# Patient Record
Sex: Male | Born: 1985 | Race: Black or African American | Hispanic: No | Marital: Single | Smoking: Former smoker
Health system: Southern US, Community
[De-identification: ages and names within clinical notes are randomized; demographics above are authoritative.]

## PROBLEM LIST (undated history)

## (undated) DIAGNOSIS — F1910 Other psychoactive substance abuse, uncomplicated: Secondary | ICD-10-CM

---

## 2019-12-23 ENCOUNTER — Other Ambulatory Visit: Payer: Self-pay

## 2019-12-23 ENCOUNTER — Emergency Department (HOSPITAL_COMMUNITY)
Admission: EM | Admit: 2019-12-23 | Discharge: 2019-12-23 | Disposition: A | Discharge status: Home / Self Care | Payer: Self-pay | Attending: Emergency Medicine | Admitting: Emergency Medicine

## 2019-12-23 ENCOUNTER — Telehealth (HOSPITAL_COMMUNITY): Payer: Self-pay | Admitting: Emergency Medicine

## 2019-12-23 ENCOUNTER — Encounter (HOSPITAL_COMMUNITY): Payer: Self-pay | Admitting: Obstetrics and Gynecology

## 2019-12-23 ENCOUNTER — Emergency Department (HOSPITAL_COMMUNITY): Payer: Self-pay

## 2019-12-23 DIAGNOSIS — Z87891 Personal history of nicotine dependence: Secondary | ICD-10-CM | POA: Insufficient documentation

## 2019-12-23 DIAGNOSIS — R1032 Left lower quadrant pain: Secondary | ICD-10-CM | POA: Insufficient documentation

## 2019-12-23 DIAGNOSIS — R109 Unspecified abdominal pain: Secondary | ICD-10-CM

## 2019-12-23 DIAGNOSIS — M545 Low back pain, unspecified: Secondary | ICD-10-CM

## 2019-12-23 HISTORY — DX: Other psychoactive substance abuse, uncomplicated: F19.10

## 2019-12-23 LAB — COMPREHENSIVE METABOLIC PANEL
ALT: 45 U/L — ABNORMAL HIGH (ref 0–44)
AST: 28 U/L (ref 15–41)
Albumin: 4 g/dL (ref 3.5–5.0)
Alkaline Phosphatase: 66 U/L (ref 38–126)
Anion gap: 8 (ref 5–15)
BUN: 17 mg/dL (ref 6–20)
CO2: 25 mmol/L (ref 22–32)
Calcium: 8.9 mg/dL (ref 8.9–10.3)
Chloride: 106 mmol/L (ref 98–111)
Creatinine, Ser: 1.18 mg/dL (ref 0.61–1.24)
GFR calc Af Amer: >60 mL/min (ref >60)
GFR calc non Af Amer: >60 mL/min (ref >60)
Glucose, Bld: 94 mg/dL (ref 70–99)
Potassium: 3.9 mmol/L (ref 3.5–5.1)
Sodium: 139 mmol/L (ref 135–145)
Total Bilirubin: 0.8 mg/dL (ref 0.3–1.2)
Total Protein: 7.2 g/dL (ref 6.5–8.1)

## 2019-12-23 LAB — CBC
HCT: 46.2 % (ref 39.0–52.0)
Hemoglobin: 15.1 g/dL (ref 13.0–17.0)
MCH: 31 pg (ref 26.0–34.0)
MCHC: 32.7 g/dL (ref 30.0–36.0)
MCV: 94.9 fL (ref 80.0–100.0)
Platelets: 224 10*3/uL (ref 150–400)
RBC: 4.87 MIL/uL (ref 4.22–5.81)
RDW: 12 % (ref 11.5–15.5)
WBC: 5 10*3/uL (ref 4.0–10.5)
nRBC: 0 % (ref 0.0–0.2)

## 2019-12-23 LAB — URINALYSIS, ROUTINE W REFLEX MICROSCOPIC
Bilirubin Urine: NEGATIVE
Glucose, UA: NEGATIVE mg/dL
Hgb urine dipstick: NEGATIVE
Ketones, ur: NEGATIVE mg/dL
Leukocytes,Ua: NEGATIVE
Nitrite: NEGATIVE
Protein, ur: NEGATIVE mg/dL
Specific Gravity, Urine: 1.026 (ref 1.005–1.030)
pH: 5 (ref 5.0–8.0)

## 2019-12-23 LAB — LIPASE, BLOOD: Lipase: 33 U/L (ref 11–51)

## 2019-12-23 MED ORDER — HYDROCODONE-HOMATROPINE 5-1.5 MG/5ML PO SYRP: 5.0000 mL | ORAL_SOLUTION | Freq: Four times a day (QID) | ORAL | 0 refills | Status: AC | PRN

## 2019-12-23 MED ORDER — KETOROLAC TROMETHAMINE 30 MG/ML IJ SOLN
30.0000 mg | Freq: Once | INTRAMUSCULAR | Status: AC
  Administered 2019-12-23: 30 mg via INTRAVENOUS
  Filled 2019-12-23: qty 1

## 2019-12-23 MED ORDER — ACETAMINOPHEN 325 MG PO TABS
650.0000 mg | ORAL_TABLET | Freq: Once | ORAL | Status: AC
  Administered 2019-12-23: 650 mg via ORAL
  Filled 2019-12-23: qty 2

## 2019-12-23 MED ORDER — CYCLOBENZAPRINE HCL 10 MG PO TABS: 10.0000 mg | ORAL_TABLET | Freq: Two times a day (BID) | ORAL | 0 refills | Status: AC | PRN

## 2019-12-23 MED ORDER — SODIUM CHLORIDE 0.9% FLUSH: 3.0000 mL | Freq: Once | INTRAVENOUS | Status: DC

## 2019-12-23 NOTE — Telephone Encounter (Signed)
9:25 AM Received call from [facility] reporting that the patient's prescription from overnight they cannot fill due to not having the medication in stock.  They requested switching to Hydromet.  Medication prescription will be changed and this was sent.

## 2019-12-23 NOTE — Discharge Instructions (Signed)
Your work-up today was overall reassuring and we did not find any evidence of kidney stone or infection causing her symptoms.  I suspect a musculoskeletal type pain given the small muscle spasms we palpated.  Please use the muscle relaxant to help with your symptoms and you may use over-the-counter medication such as Motrin and Tylenol.  Please rest and stay hydrated.  If any symptoms change or worsen, please return to the nearest emergency department.

## 2019-12-23 NOTE — ED Triage Notes (Signed)
Pt reports left sided abdominal pain. Patient is pointing to rib cage and describing a stabbing pain.  Patient denies N/V but reports incidence of diarrhea. Patient reports pain after eating.

## 2019-12-23 NOTE — ED Provider Notes (Signed)
[facility] DEPT Provider Note   CSN: [id number] Arrival date & time: 12/23/19  1950     History Chief Complaint  Patient presents with  . Abdominal Pain    Brian Hensley is a 33 y.o. male.  The history is provided by the patient and medical records. No language interpreter was used.  Flank Pain This is a new problem. The current episode started more than 2 days ago. The problem occurs constantly. The problem has not changed since onset.Associated symptoms include abdominal pain. Pertinent negatives include no chest pain, no headaches and no shortness of breath. Nothing aggravates the symptoms. Nothing relieves the symptoms. He has tried nothing for the symptoms. The treatment provided no relief.       Past Medical History:  Diagnosis Date  . Drug abuse ([place])     There are no problems to display for this patient.   History reviewed. No pertinent surgical history.     History reviewed. No pertinent family history.  Social History   Tobacco Use  . Smoking status: Former Smoker  Substance Use Topics  . Alcohol use: Not Currently    Comment: Pt is in [employer]  . Drug use: Not Currently    Home Medications Prior to Admission medications   Not on File    Allergies    Patient has no allergy information on record.  Review of Systems   Review of Systems  Constitutional: Negative for chills, fatigue and fever.  HENT: Negative for congestion and rhinorrhea.   Respiratory: Negative for cough, chest tightness, shortness of breath and wheezing.   Cardiovascular: Negative for chest pain, palpitations and leg swelling.  Gastrointestinal: Positive for abdominal pain. Negative for constipation, diarrhea, nausea and vomiting.  Genitourinary: Positive for flank pain. Negative for decreased urine volume, dysuria, frequency, genital sores, scrotal swelling, testicular pain and urgency.       Urine darker  Musculoskeletal: Positive  for back pain. Negative for neck pain and neck stiffness.  Skin: Negative for rash and wound.  Neurological: Negative for light-headedness and headaches.  Psychiatric/Behavioral: Negative for agitation.  All other systems reviewed and are negative.   Physical Exam Updated Vital Signs BP 134/78 (BP Location: Left Arm)   Pulse 86   Temp 98.6 F (37 C) (Oral)   Resp 18   Ht 5\' 11"  (1.803 m)   Wt 102.1 kg   SpO2 99%   BMI 31.38 kg/m   Physical Exam Vitals and nursing note reviewed.  Constitutional:      General: He is not in acute distress.    Appearance: He is well-developed. He is not ill-appearing, toxic-appearing or diaphoretic.  HENT:     Head: Normocephalic and atraumatic.  Eyes:     Conjunctiva/sclera: Conjunctivae normal.  Cardiovascular:     Rate and Rhythm: Normal rate and regular rhythm.     Heart sounds: No murmur.  Pulmonary:     Effort: Pulmonary effort is normal. No respiratory distress.     Breath sounds: Normal breath sounds.  Abdominal:     General: Abdomen is flat. Bowel sounds are normal.     Palpations: Abdomen is soft.     Tenderness: There is no abdominal tenderness. There is no right CVA tenderness or left CVA tenderness.    Musculoskeletal:     Cervical back: Neck supple.       Back:  Skin:    General: Skin is warm and dry.  Neurological:     General:  No focal deficit present.     Mental Status: He is alert.     ED Results / Procedures / Treatments   Labs (all labs ordered are listed, but only abnormal results are displayed) Labs Reviewed  COMPREHENSIVE METABOLIC PANEL - Abnormal; Notable for the following components:      Result Value   ALT 45 (*)    All other components within normal limits  URINE CULTURE  LIPASE, BLOOD  CBC  URINALYSIS, ROUTINE W REFLEX MICROSCOPIC    EKG None  Radiology CT Renal Stone Study  Result Date: 12/23/2019 CLINICAL DATA:  Left-sided abdominal/flank pain EXAM: CT ABDOMEN AND PELVIS WITHOUT  CONTRAST TECHNIQUE: Multidetector CT imaging of the abdomen and pelvis was performed following the standard protocol without oral or IV contrast. COMPARISON:  None. FINDINGS: Lower chest: There is mild atelectasis in the lung bases. There is no lung base edema or consolidation. Hepatobiliary: No focal liver lesions are appreciable on this noncontrast enhanced study. Gallbladder wall does not appear thickened. There is no biliary duct dilatation. Pancreas: There is no pancreatic mass or inflammatory focus. Spleen: No splenic lesions are evident. Adrenals/Urinary Tract: Adrenals bilaterally appear normal. Kidneys bilaterally show no evident mass or hydronephrosis on either side. There is no appreciable renal or ureteral calculus on either side. There are phleboliths on the left, apparently inferior to the distal left ureter. Urinary bladder is midline with wall thickness within normal limits. Stomach/Bowel: There is no appreciable bowel wall or mesenteric thickening. Moderate stool noted in colon. Terminal ileum appears normal. There is no evident bowel obstruction. There is no free air or portal venous air. Vascular/Lymphatic: No abdominal aortic aneurysm. No vascular lesions are appreciable this noncontrast enhanced study. There is no evident adenopathy in the abdomen or pelvis. Reproductive: There are occasional tiny prostatic calculi. Prostate and seminal vesicles are normal in size and configuration. No evident pelvic mass. Other: The appendix appears normal. There is no evident abscess or ascites in the abdomen or pelvis. Musculoskeletal: There are no blastic or lytic bone lesions. There is no intramuscular or abdominal wall lesion. IMPRESSION: 1. A cause for patient's symptoms has not been established with this study. 2. No renal or ureteral calculi. No hydronephrosis. Urinary bladder wall thickness normal. There are occasional tiny calcifications in the prostate. 3. No bowel obstruction. No abscess in the  abdomen or pelvis. Appendix appears normal. Electronically Signed   By: Bretta Bang III M.D.   On: 12/23/2019 09:55    Procedures Procedures (including critical care time)  Medications Ordered in ED Medications  ketorolac (TORADOL) 30 MG/ML injection 30 mg (30 mg Intravenous Given 12/23/19 0936)  acetaminophen (TYLENOL) tablet 650 mg (650 mg Oral Given 12/23/19 6256)    ED Course  I have reviewed the triage vital signs and the nursing notes.  Pertinent labs & imaging results that were available during my care of the patient were reviewed by me and considered in my medical decision making (see chart for details).    MDM Rules/Calculators/A&P                      Brian Hensley is a 33 y.o. male with a past medical history significant for narcotic abuse who presents with left flank pain for the last 4 days.  Patient reports that he has never had this pain before and for the last 4 days he has had severe pain in his left flank and left back rating around towards his left  abdomen.  He has no history of kidney stones but does report his urine is darker.  He denies dysuria.  He denies fevers, chills, nausea, vomiting, constipation, or trauma.  He does report some mild diarrhea.  No recent Covid contacts.  Patient describes his pain is a 9-10 and stabbing.  He cannot get comfortable.  He denies any history of diverticulitis or other abdominal problems.  No history of pancreatitis.  He does report that his pain is worsened after he eats or drinks.  He denies any respiratory symptoms.  On exam, patient does have tenderness in his left CVA and left flank area.  No rash to suggest shingles.  No other abdominal tenderness.  Lungs clear and chest is nontender.  Midline is nontender in the back.  No leg symptoms.  He denied current scrotal pain or swelling.  Clinically most concerned about kidney stone versus musculoskeletal versus a pancreatic issue.  Will get labs and CT stone study.  Will  get urinalysis with culture.  I spoke with the patient's narcotic recovery advocate and we all 3 agreed to hold on narcotic ministration until [place] necessary.  We agreed to get the labs to see if he is a candidate for nonnarcotic treatment such as Tylenol or Toradol when appropriate.  We will give this medication when his labs return if it is okay.  Anticipate reassessment after work-up.  12:07 PM Patient's work-up is returned overall reassuring.  The CT scan does not show any evidence of kidney stone or other abnormality.  Unclear etiology at this time given reassuring labs and urinalysis.  Suspect musculoskeletal pain and muscle spasm.  We will continue the nonnarcotic plan and give the patient a prescription for muscle relaxant and have him use over-the-counter medications.  He will follow-up with a PCP and understood return precautions.  He had otherwise no concerns and was discharged in good condition.   Final Clinical Impression(s) / ED Diagnoses Final diagnoses:  Left flank pain  Acute left-sided low back pain without sciatica    Rx / DC Orders ED Discharge Orders         Ordered    cyclobenzaprine (FLEXERIL) 10 MG tablet  2 times daily PRN     12/23/19 1209         Clinical Impression: 1. Left flank pain   2. Acute left-sided low back pain without sciatica     Disposition: Discharge  Condition: Good  I have discussed the results, Dx and Tx plan with the pt(& family if present). He/she/they expressed understanding and agree(s) with the plan. Discharge instructions discussed at great length. Strict return precautions discussed and pt &/or family have verbalized understanding of the instructions. No further questions at time of discharge.    New Prescriptions   CYCLOBENZAPRINE (FLEXERIL) 10 MG TABLET    Take 1 tablet (10 mg total) by mouth 2 (two) times daily as needed for muscle spasms.   HYDROCODONE-HOMATROPINE (HYCODAN) 5-1.5 MG/5ML SYRUP    Take 5 mLs by mouth  every 6 (six) hours as needed for cough.    Follow Up: [facility] [address] [place] [id number] [phone] Schedule an appointment as soon as possible for a visit    [facility] [address] [id number] [place] [phone]       Tahjay Binion, Canary Brimhristopher J, MD 12/23/19 1210

## 2019-12-24 LAB — URINE CULTURE: Culture: NO GROWTH

## 2021-03-05 IMAGING — CT CT RENAL STONE PROTOCOL
2 of 4 series · 16 of 46 positions shown, 18 images · non-contrast
Comparison: None.

CLINICAL DATA: Left-sided abdominal/flank pain

EXAM:
CT ABDOMEN AND PELVIS WITHOUT CONTRAST
TECHNIQUE: Multidetector CT imaging of the abdomen and pelvis was performed
following the standard protocol without oral or IV contrast.

[Series 2: axial st · axial · 0.75mm/px · z∈[-486,-56]mm · 13 of 98 slices shown, 15 images]
[im 6/98  soft-tissue]
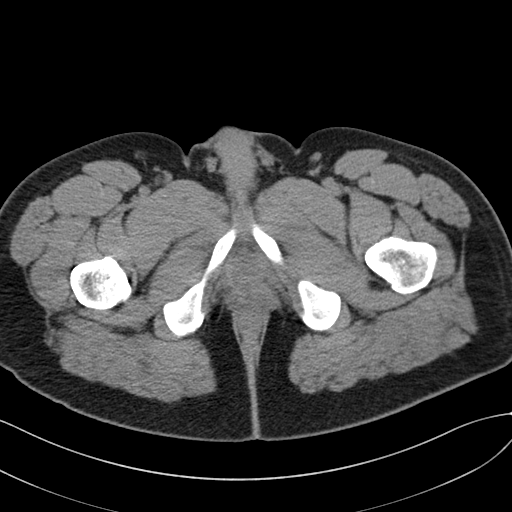
[im 6/98  bone]
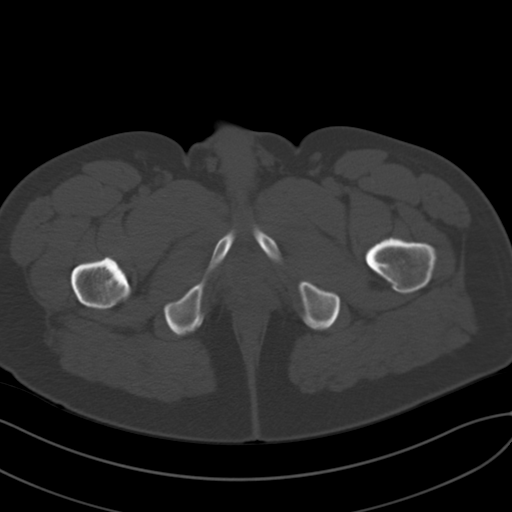
[im 16/98  soft-tissue]
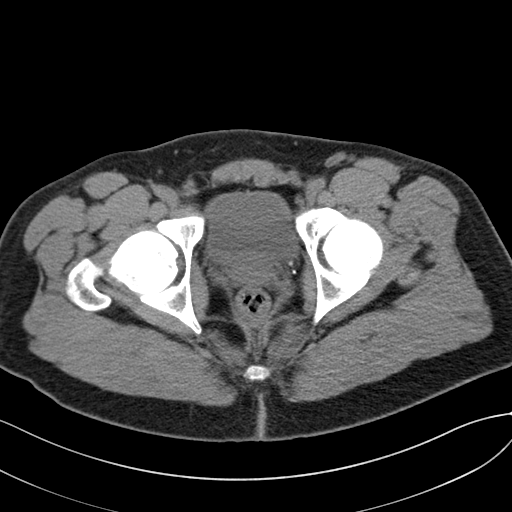
[im 21/98  soft-tissue]
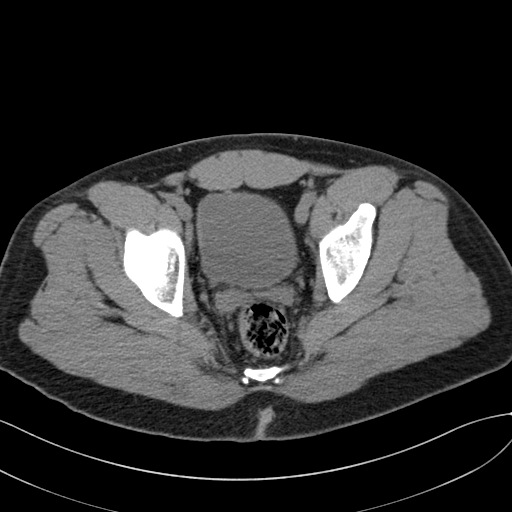
[im 26/98  soft-tissue]
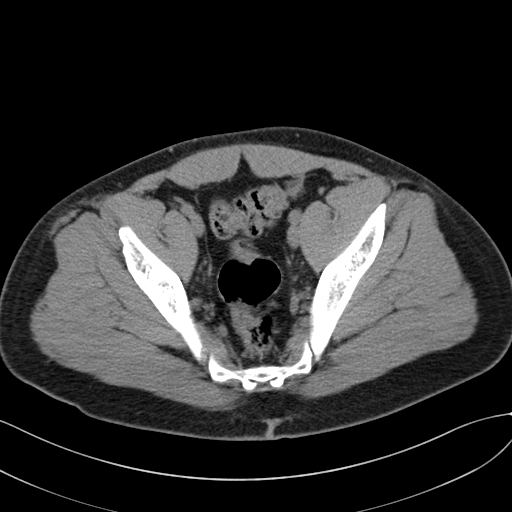
[im 36/98  soft-tissue]
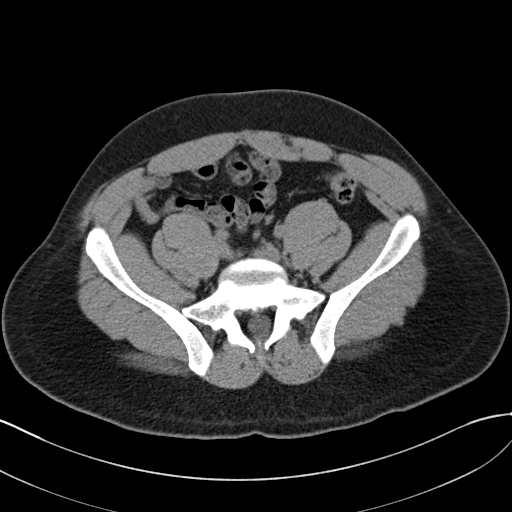
[im 41/98  soft-tissue]
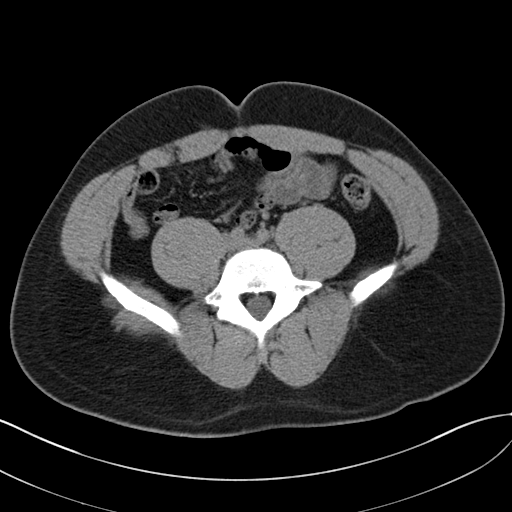
[im 52/98  soft-tissue]
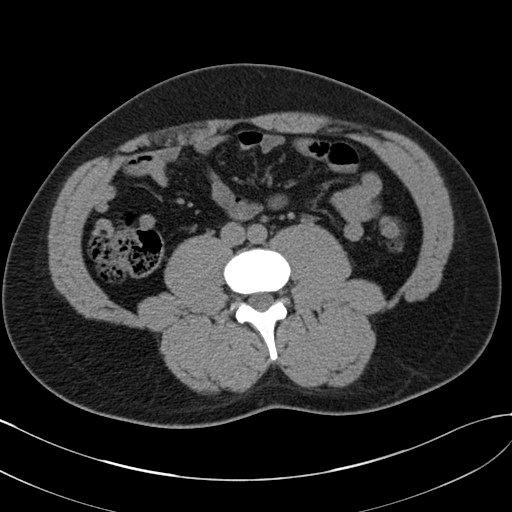
[im 57/98  soft-tissue]
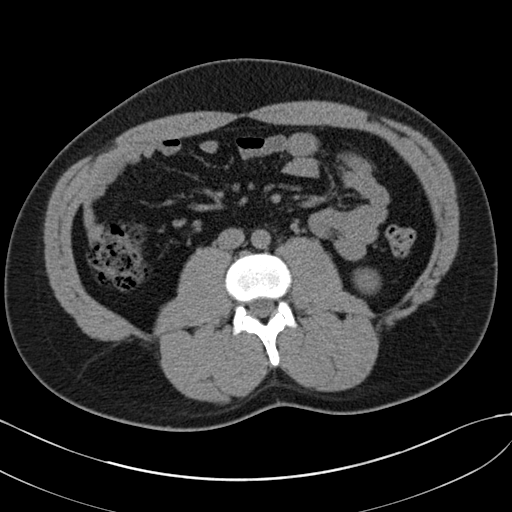
[im 62/98  soft-tissue]
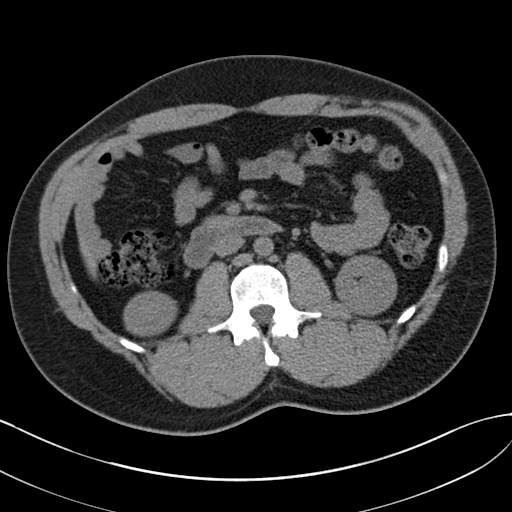
[im 62/98  bone]
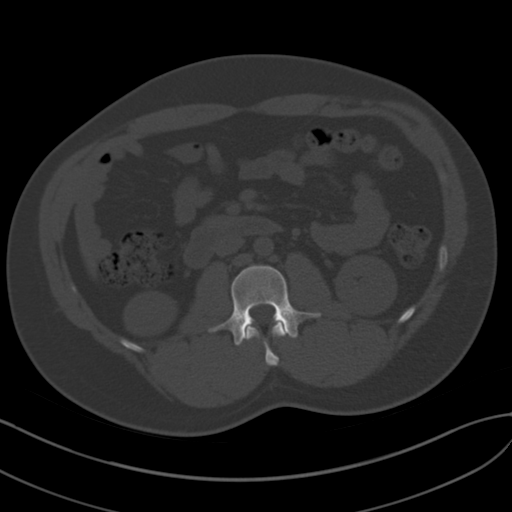
[im 72/98  soft-tissue]
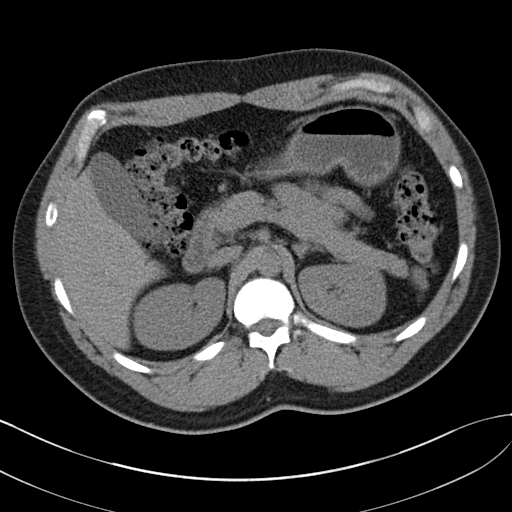
[im 77/98  soft-tissue]
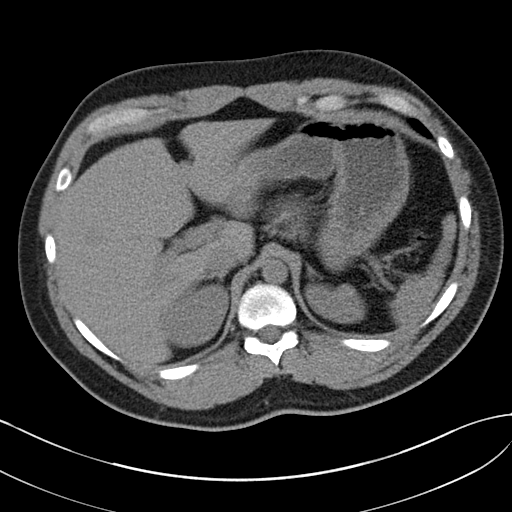
[im 82/98  soft-tissue]
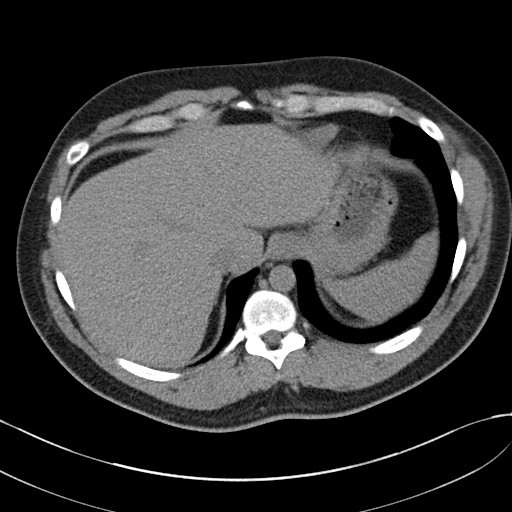
[im 92/98  soft-tissue]
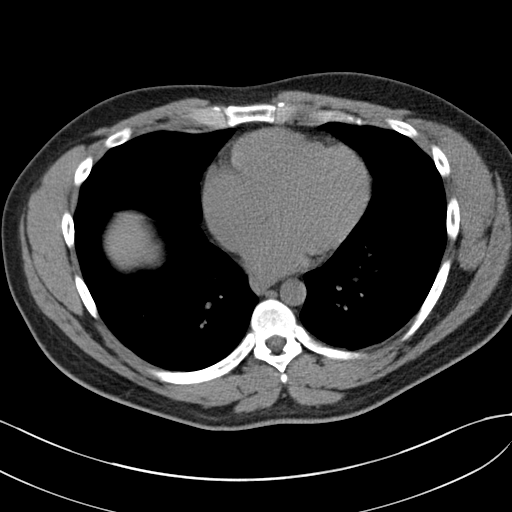

[Series 4: coronal · coronal · 0.84mm/px · 3 of 142 slices shown]
[im 48/142  soft-tissue]
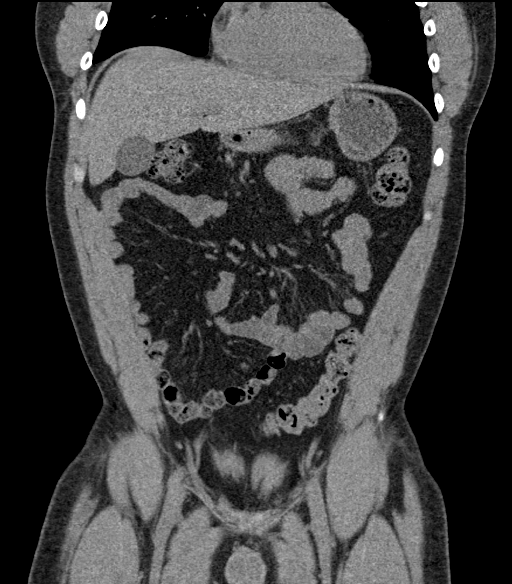
[im 63/142  soft-tissue]
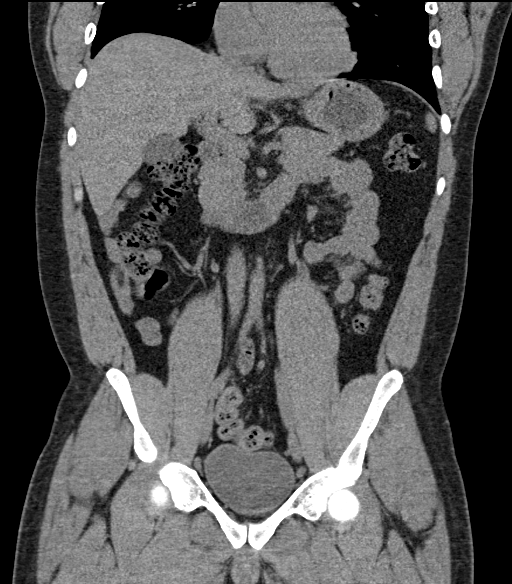
[im 79/142  soft-tissue]
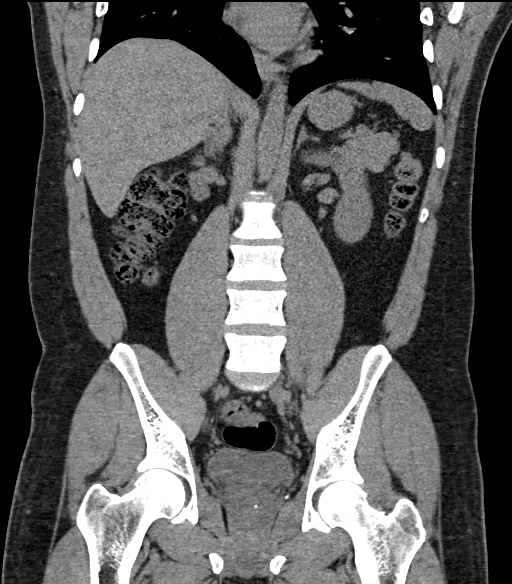

[16 of 46 positions shown; findings below may reference images not displayed]

FINDINGS: Lower chest: There is mild atelectasis in the lung bases. There is
no lung base edema or consolidation.

Hepatobiliary: No focal liver lesions are appreciable on this
noncontrast enhanced study. Gallbladder wall does not appear
thickened. There is no biliary duct dilatation.

Pancreas: There is no pancreatic mass or inflammatory focus.

Spleen: No splenic lesions are evident.

Adrenals/Urinary Tract: Adrenals bilaterally appear normal. Kidneys
bilaterally show no evident mass or hydronephrosis on either side.
There is no appreciable renal or ureteral calculus on either side.
There are phleboliths on the left, apparently inferior to the distal
left ureter. Urinary bladder is midline with wall thickness within
normal limits.

Stomach/Bowel: There is no appreciable bowel wall or mesenteric
thickening. Moderate stool noted in colon. Terminal ileum appears
normal. There is no evident bowel obstruction. There is no free air
or portal venous air.

Vascular/Lymphatic: No abdominal aortic aneurysm. No vascular
lesions are appreciable this noncontrast enhanced study. There is no
evident adenopathy in the abdomen or pelvis.

Reproductive: There are occasional tiny prostatic calculi. Prostate
and seminal vesicles are normal in size and configuration. No
evident pelvic mass.

Other: The appendix appears normal. There is no evident abscess or
ascites in the abdomen or pelvis.

Musculoskeletal: There are no blastic or lytic bone lesions. There
is no intramuscular or abdominal wall lesion.
IMPRESSION: 1. A cause for patient's symptoms has not been established with this
study.

2. No renal or ureteral calculi. No hydronephrosis. Urinary bladder
wall thickness normal. There are occasional tiny calcifications in
the prostate.

3. No bowel obstruction. No abscess in the abdomen or pelvis.
Appendix appears normal.
